# Patient Record
Sex: Male | Born: 1985 | Race: White | Hispanic: No | Smoking: Never smoker
Health system: Southern US, Community
[De-identification: ages and names within clinical notes are randomized; demographics above are authoritative.]

## PROBLEM LIST (undated history)

## (undated) DIAGNOSIS — T7840XA Allergy, unspecified, initial encounter: Secondary | ICD-10-CM

## (undated) DIAGNOSIS — B019 Varicella without complication: Secondary | ICD-10-CM

## (undated) HISTORY — DX: Varicella without complication: B01.9

## (undated) HISTORY — PX: OTHER SURGICAL HISTORY: SHX169

## (undated) HISTORY — DX: Allergy, unspecified, initial encounter: T78.40XA

---

## 2011-10-01 ENCOUNTER — Ambulatory Visit: Payer: Self-pay | Admitting: Family Medicine

## 2011-10-07 ENCOUNTER — Encounter: Payer: Self-pay | Admitting: Family Medicine

## 2011-10-07 ENCOUNTER — Ambulatory Visit (INDEPENDENT_AMBULATORY_CARE_PROVIDER_SITE_OTHER): Payer: Managed Care, Other (non HMO) | Admitting: Family Medicine

## 2011-10-07 DIAGNOSIS — Z0000 Encounter for general adult medical examination without abnormal findings: Secondary | ICD-10-CM

## 2011-10-07 DIAGNOSIS — R05 Cough: Secondary | ICD-10-CM

## 2011-10-07 MED ORDER — TETANUS-DIPHTH-ACELL PERTUSSIS 5-2.5-18.5 LF-MCG/0.5 IM SUSP
0.5000 mL | Freq: Once | INTRAMUSCULAR | Status: AC
  Administered 2011-10-07: 0.5 mL via INTRAMUSCULAR

## 2011-10-07 NOTE — Patient Instructions (Signed)
We'll notify you of your lab results Start the Prilosec OTC once daily to see if cough improves- if yes, call and we'll send in a prescription.  If no, we may need to do additional work up Call with any questions or concerns Your exam looks great! Welcome!  We're glad to have you!!!

## 2011-10-07 NOTE — Assessment & Plan Note (Signed)
New.  Pt's PE WNL.  Check labs.  Tdap booster given.  Anticipatory guidance provided.

## 2011-10-07 NOTE — Progress Notes (Signed)
  Subjective:    Patient ID: Dennis Mcclure, male    DOB: March 21, 1986, 26 y.o.   MRN: [id number]  HPI New to establish.  No prior PCP.  Desires CPE.   Review of Systems Patient reports no vision/hearing changes, anorexia, fever ,adenopathy, persistant/recurrent hoarseness, swallowing issues, chest pain, palpitations, edema, hemoptysis, dyspnea (rest,exertional, paroxysmal nocturnal), gastrointestinal  bleeding (melena, rectal bleeding), abdominal pain, excessive heart burn, GU symptoms (dysuria, hematuria, voiding/incontinence issues) syncope, focal weakness, memory loss, numbness & tingling, skin/hair/nail changes, depression, anxiety, abnormal bruising/bleeding, musculoskeletal symptoms/signs.   + cough w/ reclining or lying down.  Cannot relate this to food.  Denies burning, sour brash, SOB.  Cough is dry, not productive.  'i love spicy food'.    Objective:   Physical Exam BP 120/75  Pulse 71  Temp(Src) 98.2 F (36.8 C) (Oral)  Ht 5' 5.5" (1.664 m)  Wt 143 lb 3.2 oz (64.955 kg)  BMI 23.47 kg/m2  SpO2 96%  General Appearance:    Alert, cooperative, no distress, appears stated age  Head:    Normocephalic, without obvious abnormality, atraumatic  Eyes:    PERRL, conjunctiva/corneas clear, EOM's intact, fundi    benign, both eyes       Ears:    Normal TM's and external ear canals, both ears  Nose:   Nares normal, septum midline, mucosa normal, no drainage   or sinus tenderness  Throat:   Lips, mucosa, and tongue normal; teeth and gums normal  Neck:   Supple, symmetrical, trachea midline, no adenopathy;       thyroid:  No enlargement/tenderness/nodules  Back:     Symmetric, no curvature, ROM normal, no CVA tenderness  Lungs:     Clear to auscultation bilaterally, respirations unlabored  Chest wall:    No tenderness or deformity  Heart:    Regular rate and rhythm, S1 and S2 normal, no murmur, rub   or gallop  Abdomen:     Soft, non-tender, bowel sounds active all four quadrants,    no  masses, no organomegaly  Genitalia:    Normal male without lesion, discharge or tenderness  Rectal:    Deferred due to young age  Extremities:   Extremities normal, atraumatic, no cyanosis or edema  Pulses:   2+ and symmetric all extremities  Skin:   Skin color, texture, turgor normal, no rashes or lesions  Lymph nodes:   Cervical, supraclavicular, and axillary nodes normal  Neurologic:   CNII-XII intact. Normal strength, sensation and reflexes      throughout          Assessment & Plan:

## 2011-10-07 NOTE — Assessment & Plan Note (Signed)
New.  Suspect this is due to silent GERD.  Samples of Prilosec OTC given.  Will follow.

## 2011-10-08 LAB — CBC WITH DIFFERENTIAL/PLATELET
Basophils Relative: 0.5 % (ref 0.0–3.0)
Eosinophils Absolute: 0.1 10*3/uL (ref 0.0–0.7)
MCHC: 33.4 g/dL (ref 30.0–36.0)
MCV: 85.3 fl (ref 78.0–100.0)
Monocytes Absolute: 0.5 10*3/uL (ref 0.1–1.0)
Neutrophils Relative %: 28.9 % — ABNORMAL LOW (ref 43.0–77.0)
RBC: 5.06 Mil/uL (ref 4.22–5.81)

## 2011-10-08 LAB — LIPID PANEL
Cholesterol: 99 mg/dL (ref 0–200)
HDL: 38.7 mg/dL — ABNORMAL LOW (ref >39.00)
Triglycerides: 51 mg/dL (ref 0.0–149.0)
VLDL: 10.2 mg/dL (ref 0.0–40.0)

## 2011-10-08 LAB — BASIC METABOLIC PANEL
BUN: 11 mg/dL (ref 6–23)
Creatinine, Ser: 0.8 mg/dL (ref 0.4–1.5)
GFR: 119.4 mL/min (ref >60.00)
Potassium: 3.8 mEq/L (ref 3.5–5.1)

## 2011-10-08 LAB — HEPATIC FUNCTION PANEL
AST: 23 U/L (ref 0–37)
Total Bilirubin: 0.5 mg/dL (ref 0.3–1.2)

## 2011-10-08 LAB — TSH: TSH: 1.93 u[IU]/mL (ref 0.35–5.50)

## 2012-02-28 ENCOUNTER — Encounter: Payer: Self-pay | Admitting: Family Medicine

## 2012-02-28 ENCOUNTER — Telehealth: Payer: Self-pay | Admitting: Family Medicine

## 2012-02-28 ENCOUNTER — Ambulatory Visit (INDEPENDENT_AMBULATORY_CARE_PROVIDER_SITE_OTHER): Payer: Managed Care, Other (non HMO) | Admitting: Family Medicine

## 2012-02-28 VITALS — BP 127/83 | HR 120 | Temp 100.5°F | Ht 64.5 in | Wt 148.0 lb

## 2012-02-28 DIAGNOSIS — R509 Fever, unspecified: Secondary | ICD-10-CM

## 2012-02-28 LAB — CBC WITH DIFFERENTIAL/PLATELET
Basophils Absolute: 0 10*3/uL (ref 0.0–0.1)
Eosinophils Absolute: 0 10*3/uL (ref 0.0–0.7)
HCT: 41 % (ref 39.0–52.0)
Hemoglobin: 13.7 g/dL (ref 13.0–17.0)
Lymphs Abs: 1.1 10*3/uL (ref 0.7–4.0)
MCHC: 33.3 g/dL (ref 30.0–36.0)
MCV: 84.8 fl (ref 78.0–100.0)
Neutro Abs: 5.2 10*3/uL (ref 1.4–7.7)
RDW: 12.4 % (ref 11.5–14.6)

## 2012-02-28 NOTE — Telephone Encounter (Signed)
Advised MD Beverely Low about pt sxs, wanted to see if pt could come in for the apt at 1:45pm today, per pt at that time needed to cancel and noted still on schedule, please call and offer this apt to this pt, thank you

## 2012-02-28 NOTE — Telephone Encounter (Signed)
Caller: [place]/Patient; Patient Name: Dennis Mcclure; PCP: Sheliah Hatch.; Best Callback Phone Number: [phone]. Onset 02/24/12 Patient states he has headache, non-productive  cough, vomiting,runny nose, clear in color,  fever with chills (no thermometer to take temerature).  All emergent symptoms ruled out per Cough protocol with exception New onset of two or more of the following symptoms; nasal congestion with runny nose; snaazing; itchy or mild sore throat; m,ild headache or bady aches; mild fatigue; low grade fever up to 101.5 F usually lasting about a week."  Home care advice given.

## 2012-02-28 NOTE — Progress Notes (Signed)
  Subjective:    Patient ID: Dennis Mcclure, male    DOB: [birth year], 26 y.o.   MRN: [id number]  HPI Sxs started Thursday w/ nausea.  Later developed chills.  Alternating sweats and drenching sweats.  Vomited yesterday.  + cough.  Denies facial pain/pressure.  + R ear pain.  Mild runny nose.  + HA- R occipital.  + body aches.  No known sick contacts.   Review of Systems For ROS see HPI     Objective:   Physical Exam  Constitutional: He is oriented to person, place, and time. He appears well-developed and well-nourished. He appears distressed (ill appearing w/ shaking chills).  HENT:  Head: Normocephalic and atraumatic.  Nose: Nose normal.  Mouth/Throat: Oropharynx is clear and moist. No oropharyngeal exudate.       No TTP over sinuses TMs normal bilaterally  Neck: Normal range of motion. Neck supple.  Cardiovascular:       Mildly tachy but regular  Pulmonary/Chest: Effort normal and breath sounds normal. No respiratory distress. He has no wheezes. He has no rales.       Wet cough but not productive  Abdominal: Soft. Bowel sounds are normal. He exhibits no distension. There is no tenderness. There is no rebound and no guarding.  Lymphadenopathy:    He has no cervical adenopathy.  Neurological: He is alert and oriented to person, place, and time.  Skin: Skin is warm and dry.          Assessment & Plan:

## 2012-02-28 NOTE — Patient Instructions (Addendum)
Your flu test is negative- this is good news You don't seem to have a bacterial infection- your exam is normal This is most likely a virus and should improve w/ time LOTS of fluids Alternate tylenol/ibuprofen every 4 hrs while awake for fever/chills REST! Hang in there!!!

## 2012-02-28 NOTE — Telephone Encounter (Signed)
Pt coming in at 1:45pm today

## 2012-02-29 ENCOUNTER — Emergency Department (HOSPITAL_BASED_OUTPATIENT_CLINIC_OR_DEPARTMENT_OTHER)
Admission: EM | Admit: 2012-02-29 | Discharge: 2012-02-29 | Disposition: A | Discharge status: Home / Self Care | Payer: Managed Care, Other (non HMO) | Attending: Emergency Medicine | Admitting: Emergency Medicine

## 2012-02-29 ENCOUNTER — Encounter (HOSPITAL_BASED_OUTPATIENT_CLINIC_OR_DEPARTMENT_OTHER): Payer: Self-pay | Admitting: *Deleted

## 2012-02-29 ENCOUNTER — Emergency Department (HOSPITAL_BASED_OUTPATIENT_CLINIC_OR_DEPARTMENT_OTHER): Payer: Managed Care, Other (non HMO)

## 2012-02-29 ENCOUNTER — Encounter: Payer: Self-pay | Admitting: *Deleted

## 2012-02-29 DIAGNOSIS — J189 Pneumonia, unspecified organism: Secondary | ICD-10-CM

## 2012-02-29 DIAGNOSIS — J45909 Unspecified asthma, uncomplicated: Secondary | ICD-10-CM | POA: Insufficient documentation

## 2012-02-29 MED ORDER — AZITHROMYCIN 250 MG PO TABS: 250.0000 mg | ORAL_TABLET | Freq: Every day | ORAL | Status: DC

## 2012-02-29 MED ORDER — DEXTROSE 5 % IV SOLN
1.0000 g | Freq: Once | INTRAVENOUS | Status: AC
  Administered 2012-02-29: 1 g via INTRAVENOUS
  Filled 2012-02-29: qty 10

## 2012-02-29 MED ORDER — AZITHROMYCIN 250 MG PO TABS
500.0000 mg | ORAL_TABLET | Freq: Once | ORAL | Status: AC
  Administered 2012-02-29: 500 mg via ORAL
  Filled 2012-02-29: qty 2

## 2012-02-29 NOTE — ED Notes (Signed)
Patient transported to X-ray 

## 2012-02-29 NOTE — ED Notes (Signed)
Pt was seen yesterday by PMD and dx'd with viral illness but pt states he feels worse.

## 2012-02-29 NOTE — ED Notes (Signed)
C/o fever, headache, and cough since Thursday.

## 2012-02-29 NOTE — ED Provider Notes (Signed)
History     CSN: [id number]  Arrival date & time 02/29/12  2029   First MD Initiated Contact with Patient 02/29/12 2129      Chief Complaint  Patient presents with  . Headache  . Cough     HPI Patient comes in with persistent cough, fever.  Patient was seen yesterday in private medical doctor's office.  Flu test was negative.  No x-ray was done.  Patient denies vomiting.  Has had fever and sweats.  No significant shortness of breath. Past Medical History  Diagnosis Date  . Allergy   . Asthma   . Chicken pox     History reviewed. No pertinent past surgical history.  Family History  Problem Relation Age of Onset  . Mental retardation Maternal Aunt   . Mental retardation Paternal Uncle     History  Substance Use Topics  . Smoking status: Never Smoker   . Smokeless tobacco: Not on file  . Alcohol Use: No      Review of Systems  All other systems reviewed and are negative.    Allergies  Septra  Home Medications   Current Outpatient Rx  Name Route Sig Dispense Refill  . ACETAMINOPHEN 500 MG PO TABS Oral Take 500 mg by mouth every 6 (six) hours as needed.    . IBUPROFEN 200 MG PO TABS Oral Take 200 mg by mouth every 6 (six) hours as needed.    . AZITHROMYCIN 250 MG PO TABS Oral Take 1 tablet (250 mg total) by mouth daily. Take 1 tablet daily until gone 4 tablet 0    BP 132/84  Pulse 98  Temp 98.8 F (37.1 C) (Oral)  Resp 16  Ht 5\' 5"  (1.651 m)  Wt 140 lb (63.504 kg)  BMI 23.30 kg/m2  SpO2 98%  Physical Exam  Nursing note and vitals reviewed. Constitutional: He is oriented to person, place, and time. He appears well-developed and well-nourished. No distress.  HENT:  Head: Normocephalic and atraumatic.  Eyes: Pupils are equal, round, and reactive to light.  Neck: Normal range of motion.  Cardiovascular: Normal rate and intact distal pulses.   Pulmonary/Chest: No respiratory distress. He has no wheezes. He has rales.    Abdominal: Normal  appearance. He exhibits no distension.  Musculoskeletal: Normal range of motion.  Neurological: He is alert and oriented to person, place, and time. No cranial nerve deficit.  Skin: Skin is warm and dry. No rash noted.  Psychiatric: He has a normal mood and affect. His behavior is normal.    ED Course  Procedures (including critical care time)  Medications  acetaminophen (TYLENOL) 500 MG tablet (not administered)  ibuprofen (ADVIL,MOTRIN) 200 MG tablet (not administered)  cefTRIAXone (ROCEPHIN) 1 g in dextrose 5 % 50 mL IVPB (not administered)  azithromycin (ZITHROMAX) tablet 500 mg (not administered)  azithromycin (ZITHROMAX) 250 MG tablet (not administered)    Labs Reviewed - No data to display Dg Chest 2 View  02/29/2012  *RADIOLOGY REPORT*  Clinical Data: Cough, fever, and sore throat.  CHEST - 2 VIEW  Comparison: None.  Findings: There is hazy airspace opacity in the inferior aspect of the left upper lung and lingula consistent with focal pneumonia. Heart size and pulmonary vascularity are normal.  No blunting of costophrenic angles.  No pneumothorax.  Mediastinal contours appear intact.  Atrophy of the left first rib.  IMPRESSION: Airspace opacity in the left upper lung consistent with pneumonia.   Original Report Authenticated By: Marlon Pel,  M.D.      1. Community acquired pneumonia       MDM          Nelia Shi, MD 02/29/12 [phone]

## 2012-02-29 NOTE — ED Notes (Signed)
Pt tested negative for flu at pcp office yesterday

## 2012-02-29 NOTE — ED Notes (Signed)
Pt had visit with pcp yesterday diagnosed with viral illness,  +chills and diaphoresis, reports pain in right ear,

## 2012-02-29 NOTE — Discharge Instructions (Signed)

## 2012-03-07 NOTE — Assessment & Plan Note (Signed)
New.  Pt obviously not feeling well but no evidence of bacterial infxn on PE.  Lungs CTA, no sinus tenderness, TMs normal, abdomen benign.  Check CBC to assess WBC.  If WBC even slightly elevated will start abx for presumed bacterial infxn.  At this time, hesitant to start meds b/c I'm not sure what I would be treating.  Reviewed supportive care and red flags that should prompt return.  Pt expressed understanding and is in agreement w/ plan.

## 2012-03-09 ENCOUNTER — Ambulatory Visit (INDEPENDENT_AMBULATORY_CARE_PROVIDER_SITE_OTHER): Payer: Managed Care, Other (non HMO) | Admitting: Family Medicine

## 2012-03-09 ENCOUNTER — Encounter: Payer: Self-pay | Admitting: Family Medicine

## 2012-03-09 VITALS — BP 104/78 | HR 91 | Temp 98.3°F | Ht 66.0 in | Wt 146.6 lb

## 2012-03-09 DIAGNOSIS — J9801 Acute bronchospasm: Secondary | ICD-10-CM

## 2012-03-09 NOTE — Progress Notes (Signed)
  Subjective:    Patient ID: Dennis Mcclure, male    DOB: 19-Feb-1986, 26 y.o.   MRN: [id number]  HPI PNA- dx'd in ER recently and tx'd w/ Zpack.  Pt has hx of asthma triggered by URI.  Has previously used inhalers.  Is finding himself short of breath w/ cold air exposure and exercise.  No cough, no fevers.   Review of Systems For ROS see HPI     Objective:   Physical Exam  Vitals reviewed. Constitutional: He appears well-developed and well-nourished. No distress.  HENT:  Head: Normocephalic and atraumatic.  Nose: Nose normal.  Mouth/Throat: Oropharynx is clear and moist. No oropharyngeal exudate.  Neck: Normal range of motion. Neck supple.  Cardiovascular: Normal rate, regular rhythm and normal heart sounds.   Pulmonary/Chest: Effort normal and breath sounds normal. No respiratory distress. He has no wheezes. He has no rales.  Lymphadenopathy:    He has no cervical adenopathy.          Assessment & Plan:

## 2012-03-09 NOTE — Patient Instructions (Addendum)
This is bronchospasm (asthma) triggered by your recent infection Use the albuterol inhaler as needed- 2 puffs- for shortness of breath or wheezing REST!  You still have to recover! Ease back into exercise Call with any questions or concerns Hang in there!

## 2012-03-14 ENCOUNTER — Encounter: Payer: Self-pay | Admitting: Family Medicine

## 2012-03-14 NOTE — Assessment & Plan Note (Signed)
New.  Occuring after recent PNA.  Start albuterol HFA- sample given.  Reviewed supportive care and red flags that should prompt return.  Pt expressed understanding and is in agreement w/ plan.

## 2012-05-24 ENCOUNTER — Ambulatory Visit (INDEPENDENT_AMBULATORY_CARE_PROVIDER_SITE_OTHER): Payer: Managed Care, Other (non HMO) | Admitting: Family Medicine

## 2012-05-24 ENCOUNTER — Encounter: Payer: Self-pay | Admitting: Family Medicine

## 2012-05-24 VITALS — BP 110/60 | HR 85 | Temp 98.3°F | Ht 65.25 in | Wt 140.4 lb

## 2012-05-24 DIAGNOSIS — J329 Chronic sinusitis, unspecified: Secondary | ICD-10-CM

## 2012-05-24 NOTE — Progress Notes (Signed)
  Subjective:    Patient ID: Dennis Mcclure, male    DOB: 01/14/1986, 27 y.o.   MRN: [id number]  HPI URI- pt reports his daughter was 1st sick over Christmas, wife w/ sinus infection, yesterday had sinus pain/pressure.  Today some productive cough.  No fever currently- Tm 100 last week.  No N/V, + diarrhea initially but this resolved.  No ear pain.  Intermittent dry cough.  Pt otherwise thinks he is starting to improve.   Review of Systems For ROS see HPI     Objective:   Physical Exam  Vitals reviewed. Constitutional: He appears well-developed and well-nourished. No distress.  HENT:  Head: Normocephalic and atraumatic.       No TTP over sinuses + turbinate edema + PND TMs normal bilaterally  Eyes: Conjunctivae normal and EOM are normal. Pupils are equal, round, and reactive to light.  Neck: Normal range of motion. Neck supple.  Cardiovascular: Normal rate, regular rhythm and normal heart sounds.   Pulmonary/Chest: Effort normal and breath sounds normal. No respiratory distress. He has no wheezes.  Lymphadenopathy:    He has no cervical adenopathy.  Skin: Skin is warm and dry.          Assessment & Plan:

## 2012-05-24 NOTE — Patient Instructions (Addendum)
This appears to be the tail end of a sinus infection No need for meds at this time but if symptoms change or worsen- call me!!! Call with any questions or concerns Happy New Year!!!

## 2012-05-25 NOTE — Assessment & Plan Note (Signed)
New.  Appears to be resolving spontaneously.  Pt well appearing, no evidence of bacterial infxn on exam today.  Warned pt of potential 2nd sickening and he is to call if he again feels poorly so we can write an abx.  Reviewed supportive care and red flags that should prompt return.  Pt expressed understanding and is in agreement w/ plan.

## 2012-06-02 ENCOUNTER — Telehealth: Payer: Self-pay | Admitting: *Deleted

## 2012-06-02 MED ORDER — AZITHROMYCIN 250 MG PO TABS: ORAL_TABLET | ORAL | Status: DC

## 2012-06-02 NOTE — Telephone Encounter (Signed)
Spoke with pts wife, she states pt is still having chest congestion with associated productive cough (yellow to green sputum). Feels poorly and wants to know if he needs to come back in or can you call in abx. Please advise.

## 2012-06-02 NOTE — Telephone Encounter (Signed)
LM @ (10:42am) asking the pt to RTC.//AB/CMA

## 2012-06-02 NOTE — Telephone Encounter (Signed)
Ok for Zpack 

## 2013-02-19 ENCOUNTER — Ambulatory Visit (INDEPENDENT_AMBULATORY_CARE_PROVIDER_SITE_OTHER): Payer: Managed Care, Other (non HMO) | Admitting: Family Medicine

## 2013-02-19 ENCOUNTER — Encounter: Payer: Self-pay | Admitting: Family Medicine

## 2013-02-19 VITALS — BP 124/82 | HR 88 | Temp 98.2°F | Resp 16 | Wt 147.2 lb

## 2013-02-19 DIAGNOSIS — J029 Acute pharyngitis, unspecified: Secondary | ICD-10-CM | POA: Insufficient documentation

## 2013-02-19 DIAGNOSIS — J329 Chronic sinusitis, unspecified: Secondary | ICD-10-CM

## 2013-02-19 LAB — POCT RAPID STREP A (OFFICE): Rapid Strep A Screen: NEGATIVE

## 2013-02-19 MED ORDER — AMOXICILLIN 875 MG PO TABS: 875.0000 mg | ORAL_TABLET | Freq: Two times a day (BID) | ORAL | Status: DC

## 2013-02-19 NOTE — Patient Instructions (Addendum)
Start the Amoxicillin twice daily- take w/ food Drink plenty of fluids Alternate tylenol and ibuprofen for pain/fever Mucinex DM to thin your congestion and for cough REST! Hang in there!

## 2013-02-19 NOTE — Assessment & Plan Note (Signed)
Pt's sxs and PE consistent w/ infxn.  Start abx.  Reviewed supportive care and red flags that should prompt return.  Pt expressed understanding and is in agreement w/ plan.  

## 2013-02-19 NOTE — Progress Notes (Signed)
  Subjective:    Patient ID: Dennis Mcclure, male    DOB: May 19, 1985, 27 y.o.   MRN: [id number]  HPI URI- sxs started 2 days ago w/ sore throat.  + nasal congestion, chills, dizziness.  + facial pressure in maxillary sinuses.  No ear pain.  Hx of PNA w/ similar sxs last year.  Coughing only to expel PND.  + vomiting this AM.  No diarrhea.   Review of Systems For ROS see HPI     Objective:   Physical Exam  Vitals reviewed. Constitutional: He appears well-developed and well-nourished. No distress.  HENT:  Head: Normocephalic and atraumatic.  Right Ear: Tympanic membrane normal.  Left Ear: Tympanic membrane normal.  Nose: Mucosal edema and rhinorrhea present. Right sinus exhibits maxillary sinus tenderness. Right sinus exhibits no frontal sinus tenderness. Left sinus exhibits maxillary sinus tenderness. Left sinus exhibits no frontal sinus tenderness.  Mouth/Throat: Mucous membranes are normal. Oropharyngeal exudate and posterior oropharyngeal erythema present. No posterior oropharyngeal edema.  + PND  Eyes: Conjunctivae and EOM are normal. Pupils are equal, round, and reactive to light.  Neck: Normal range of motion. Neck supple.  Cardiovascular: Normal rate, regular rhythm and normal heart sounds.   Pulmonary/Chest: Effort normal and breath sounds normal. No respiratory distress. He has no wheezes.  + hacking cough  Lymphadenopathy:    He has no cervical adenopathy.  Skin: Skin is warm and dry.          Assessment & Plan:

## 2013-02-19 NOTE — Assessment & Plan Note (Signed)
Rapid strep negative but will tx sinuses w/ Amox that will cover for possible strep anyway.  Pt expressed understanding and is in agreement w/ plan.

## 2013-08-09 ENCOUNTER — Encounter: Payer: Self-pay | Admitting: Family Medicine

## 2013-08-09 ENCOUNTER — Ambulatory Visit (INDEPENDENT_AMBULATORY_CARE_PROVIDER_SITE_OTHER): Payer: Managed Care, Other (non HMO) | Admitting: Family Medicine

## 2013-08-09 VITALS — BP 120/80 | HR 81 | Temp 98.4°F | Resp 16 | Wt 150.4 lb

## 2013-08-09 DIAGNOSIS — L909 Atrophic disorder of skin, unspecified: Secondary | ICD-10-CM

## 2013-08-09 DIAGNOSIS — L919 Hypertrophic disorder of the skin, unspecified: Secondary | ICD-10-CM

## 2013-08-09 DIAGNOSIS — L918 Other hypertrophic disorders of the skin: Secondary | ICD-10-CM

## 2013-08-09 NOTE — Progress Notes (Signed)
   Subjective:    Patient ID: Dennis Mcclure, male    DOB: [birth year], 28 y.o.   MRN: [id number]  HPI Mole- not painful unless snagged.  'just appeared a month ago'.  Now sticking out further than before.  No bleeding.  'this one's pink.  It's weird'.   Review of Systems For ROS see HPI     Objective:   Physical Exam  Vitals reviewed. Constitutional: He appears well-developed and well-nourished. No distress.  Skin: Skin is warm and dry.  1.5 cm pink, fleshy skin tag on L upper back. Area prepped w/ ETOH, injected w/ 1/2 cc 1% lido and skin tag removed w/ sterile scissors.  Pressure applied to stop bleeding and silver nitrate applied.  Triple abx ointment and bandaid applied.          Assessment & Plan:

## 2013-08-09 NOTE — Assessment & Plan Note (Signed)
New.  Removed w/o difficulty at pt's request due to skin irritation when it is snagged on clothing.  Pt tolerated procedure well and understand post-procedure instructions.

## 2013-08-09 NOTE — Progress Notes (Signed)
Pre visit review using our clinic review tool, if applicable. No additional management support is needed unless otherwise documented below in the visit note. 

## 2013-08-09 NOTE — Patient Instructions (Signed)
Keep area clean and dry Apply Neosporin to area 1-2x/day Watch for any green drainage or surrounding redness- this would be a sign of infection (which is always possible after breaking the skin) Call with any questions or concerns Happy Spring!

## 2013-11-12 ENCOUNTER — Ambulatory Visit (INDEPENDENT_AMBULATORY_CARE_PROVIDER_SITE_OTHER): Payer: Managed Care, Other (non HMO) | Admitting: Family Medicine

## 2013-11-12 ENCOUNTER — Encounter: Payer: Self-pay | Admitting: Family Medicine

## 2013-11-12 VITALS — BP 124/84 | HR 100 | Temp 98.8°F | Resp 16 | Wt 148.0 lb

## 2013-11-12 DIAGNOSIS — J029 Acute pharyngitis, unspecified: Secondary | ICD-10-CM

## 2013-11-12 DIAGNOSIS — J209 Acute bronchitis, unspecified: Secondary | ICD-10-CM

## 2013-11-12 LAB — POCT RAPID STREP A (OFFICE): RAPID STREP A SCREEN: NEGATIVE

## 2013-11-12 MED ORDER — AZITHROMYCIN 250 MG PO TABS: ORAL_TABLET | ORAL | Status: DC

## 2013-11-12 MED ORDER — PROMETHAZINE-DM 6.25-15 MG/5ML PO SYRP: 5.0000 mL | ORAL_SOLUTION | Freq: Four times a day (QID) | ORAL | Status: DC | PRN

## 2013-11-12 NOTE — Patient Instructions (Signed)
Follow up as needed Start the Zpack as directed Drink plenty of fluids Mucinex DM for daytime cough Cough syrup for nights/weekends REST! Call with any questions or concerns Hang in there!

## 2013-11-12 NOTE — Assessment & Plan Note (Signed)
New.  Pt's sxs and PE consistent w/ bronchitis.  Start Zpack due to recent contact w/ sick employees.  Cough meds prn.  Reviewed supportive care and red flags that should prompt return.  Pt expressed understanding and is in agreement w/ plan.

## 2013-11-12 NOTE — Progress Notes (Signed)
   Subjective:    Patient ID: Journee WoodRobet Leu, male    DOB: 07-10-1985, 28 y.o.   MRN: [id number]  Sore Throat  Associated symptoms include coughing.  Cough   URI- sxs started w/ sore throat, 'went to mucous', + productive cough, chest tightness.  Some frontal sinus pressure.  + subjective fever- 'it broke yesterday'.  sxs started 3 days ago.  No ear pain.  + sick contacts.   Review of Systems  Respiratory: Positive for cough.    For ROS see HPI     Objective:   Physical Exam  Constitutional: He appears well-developed and well-nourished. No distress.  HENT:  Head: Normocephalic and atraumatic.  No TTP over sinuses + turbinate edema + PND, bilateral tonsillar enlargement w/ erythema on exudate on L tonsil TMs normal bilaterally  Eyes: Conjunctivae and EOM are normal. Pupils are equal, round, and reactive to light.  Neck: Normal range of motion. Neck supple.  Cardiovascular: Normal rate, regular rhythm and normal heart sounds.   Pulmonary/Chest: Effort normal and breath sounds normal. No respiratory distress. He has no wheezes.  Lymphadenopathy:    He has no cervical adenopathy.  Skin: Skin is warm and dry.          Assessment & Plan:

## 2013-11-12 NOTE — Progress Notes (Signed)
Pre visit review using our clinic review tool, if applicable. No additional management support is needed unless otherwise documented below in the visit note. 

## 2013-11-12 NOTE — Assessment & Plan Note (Signed)
Rapid strep negative.  Zpack given for bronchitis would cover any possible strep.  Reviewed supportive care and red flags that should prompt return.  Pt expressed understanding and is in agreement w/ plan.

## 2013-12-07 ENCOUNTER — Ambulatory Visit (INDEPENDENT_AMBULATORY_CARE_PROVIDER_SITE_OTHER): Payer: Managed Care, Other (non HMO) | Admitting: Psychology

## 2013-12-07 DIAGNOSIS — F411 Generalized anxiety disorder: Secondary | ICD-10-CM

## 2013-12-07 DIAGNOSIS — F331 Major depressive disorder, recurrent, moderate: Secondary | ICD-10-CM

## 2013-12-21 ENCOUNTER — Telehealth: Payer: Self-pay | Admitting: Family Medicine

## 2013-12-21 MED ORDER — FLUOXETINE HCL 20 MG PO TABS: 20.0000 mg | ORAL_TABLET | Freq: Every day | ORAL | Status: DC

## 2013-12-21 NOTE — Telephone Encounter (Signed)
Called pt and lmovm to discuss medication and to verify where he would like it to be sent.

## 2013-12-21 NOTE — Telephone Encounter (Signed)
Ok for Prozac 20mg  daily, #30, 3 refills.  Needs OV to f/u in 4 weeks to recheck mood

## 2013-12-21 NOTE — Telephone Encounter (Signed)
Med filled, pt notified, and appt made.

## 2013-12-21 NOTE — Telephone Encounter (Signed)
Pt was recommend from therapist to follow up regarding discussing mild anti depressant.

## 2013-12-31 ENCOUNTER — Ambulatory Visit (INDEPENDENT_AMBULATORY_CARE_PROVIDER_SITE_OTHER): Payer: Managed Care, Other (non HMO) | Admitting: Psychology

## 2013-12-31 DIAGNOSIS — F411 Generalized anxiety disorder: Secondary | ICD-10-CM

## 2013-12-31 DIAGNOSIS — F331 Major depressive disorder, recurrent, moderate: Secondary | ICD-10-CM

## 2014-01-18 ENCOUNTER — Encounter: Payer: Self-pay | Admitting: Family Medicine

## 2014-01-18 ENCOUNTER — Ambulatory Visit (INDEPENDENT_AMBULATORY_CARE_PROVIDER_SITE_OTHER): Payer: Managed Care, Other (non HMO) | Admitting: Family Medicine

## 2014-01-18 VITALS — BP 118/80 | HR 87 | Temp 98.0°F | Resp 16 | Wt 145.1 lb

## 2014-01-18 DIAGNOSIS — F341 Dysthymic disorder: Secondary | ICD-10-CM

## 2014-01-18 DIAGNOSIS — F418 Other specified anxiety disorders: Secondary | ICD-10-CM | POA: Insufficient documentation

## 2014-01-18 DIAGNOSIS — R079 Chest pain, unspecified: Secondary | ICD-10-CM | POA: Insufficient documentation

## 2014-01-18 MED ORDER — FLUOXETINE HCL 40 MG PO CAPS: 40.0000 mg | ORAL_CAPSULE | Freq: Every day | ORAL | Status: DC

## 2014-01-18 NOTE — Progress Notes (Signed)
Pre visit review using our clinic review tool, if applicable. No additional management support is needed unless otherwise documented below in the visit note. 

## 2014-01-18 NOTE — Progress Notes (Signed)
   Subjective:    Patient ID: Dennis Mcclure, male    DOB: [birth year], 28 y.o.   MRN: [id number]  HPI Depression- pt started Prozac on 8/7.  Still having anxiety symptoms- CP, SOB when anxious or upset.  Worse w/ exertion- no cardiac w/u, pt has never mentioned this to medical provider.  No known family hx of heart issues.  No longer in counseling- 'it was marriage counseling and we decided to separate'.  Still living together despite decision to separate.  Pt is trying to go through mediation but reports things are getting hostile in regards to custody (wife wants to take daughter to [facility]).   Review of Systems For ROS see HPI     Objective:   Physical Exam  Vitals reviewed. Constitutional: He is oriented to person, place, and time. He appears well-developed and well-nourished. No distress.  HENT:  Head: Normocephalic and atraumatic.  Eyes: Conjunctivae and EOM are normal. Pupils are equal, round, and reactive to light.  Neck: Normal range of motion. Neck supple. No thyromegaly present.  Cardiovascular: Normal rate, regular rhythm, normal heart sounds and intact distal pulses.   No murmur heard. Pulmonary/Chest: Effort normal and breath sounds normal. No respiratory distress.  Musculoskeletal: He exhibits no edema.  Lymphadenopathy:    He has no cervical adenopathy.  Neurological: He is alert and oriented to person, place, and time. No cranial nerve deficit.  Skin: Skin is warm and dry.  Psychiatric: His behavior is normal.  Flat affect          Assessment & Plan:

## 2014-01-18 NOTE — Assessment & Plan Note (Signed)
New to provider.  Pt is separating from wife and facing a custody battle for daughter.  Increase Prozac to  and monitor for improvement.  Encouraged pt to find a stress outlet and recommended he seek individual counseling.  Will follow.

## 2014-01-18 NOTE — Assessment & Plan Note (Signed)
New.  Suspect this is due to pt's ongoing stress but the correlation w/ exertion is slightly atypical for stress chest pain.  EKG shows short PR and incomplete RBB.  Due to pt's sxs, will refer to cards for evaluation and tx.  Reviewed supportive care and red flags that should prompt return.  Pt expressed understanding and is in agreement w/ plan.

## 2014-01-18 NOTE — Patient Instructions (Addendum)
Follow up in 4-6 weeks to recheck mood Your EKG does show a variant that with your chest pain is worth checking out- nothing urgent, and likely normal for you, but we don't have anything to compare it to.  We'll call you with your cardiology appt Increase the Prozac to - 2 of what you have at home and one of your new script Try and find a stress outlet- art, music, exercise, talking it out If your symptoms change or worsen- please call! Hang in there!!

## 2014-03-01 ENCOUNTER — Encounter: Payer: Self-pay | Admitting: Family Medicine

## 2014-03-01 ENCOUNTER — Ambulatory Visit (INDEPENDENT_AMBULATORY_CARE_PROVIDER_SITE_OTHER): Payer: Managed Care, Other (non HMO) | Admitting: Family Medicine

## 2014-03-01 VITALS — BP 120/80 | HR 95 | Temp 98.1°F | Resp 16 | Wt 150.0 lb

## 2014-03-01 DIAGNOSIS — Z23 Encounter for immunization: Secondary | ICD-10-CM

## 2014-03-01 DIAGNOSIS — F341 Dysthymic disorder: Secondary | ICD-10-CM

## 2014-03-01 DIAGNOSIS — F418 Other specified anxiety disorders: Secondary | ICD-10-CM

## 2014-03-01 NOTE — Progress Notes (Signed)
   Subjective:    Patient ID: Robet Leuravis Allegretto, male    DOB: [birth year], 28 y.o.   MRN: [id number]  HPI Depression w/ anxiety- ongoing issue.  Ex wife and daughter left for [place] 2.5 weeks ago.  Talking to daughter daily.  Plan is for her to visit for 1 week in January.  'i don't know what i feel'.  Stopped Prozac.  'i just didn't really need it'.   Review of Systems For ROS see HPI     Objective:   Physical Exam  Vitals reviewed. Constitutional: He is oriented to person, place, and time. He appears well-developed and well-nourished. No distress.  Neurological: He is alert and oriented to person, place, and time.  Skin: Skin is warm and dry.  Psychiatric: He has a normal mood and affect. His behavior is normal. Thought content normal.          Assessment & Plan:

## 2014-03-01 NOTE — Progress Notes (Signed)
Pre visit review using our clinic review tool, if applicable. No additional management support is needed unless otherwise documented below in the visit note. 

## 2014-03-01 NOTE — Patient Instructions (Signed)
Schedule your complete physical in 6 months- sooner if you need me Keep up the good work on identifying your feelings Call if the bad days outnumber the good Call with any questions or concerns Verlon AuWe're here for you!  Call if you need us!

## 2014-03-03 NOTE — Assessment & Plan Note (Signed)
Improved since wife moved out but pt is struggling w/ daughter's move to [place].  Pt doesn't feel he needs meds at this time.  Working on better custody agreement so he would get more time w/ daughter.  Will continue to follow.  Pt aware to call if the bad days outnumber the good and we can revisit starting meds.

## 2014-03-04 ENCOUNTER — Encounter: Payer: Self-pay | Admitting: Family Medicine

## 2014-03-13 ENCOUNTER — Ambulatory Visit (INDEPENDENT_AMBULATORY_CARE_PROVIDER_SITE_OTHER): Payer: Managed Care, Other (non HMO) | Admitting: Family Medicine

## 2014-03-13 ENCOUNTER — Encounter: Payer: Self-pay | Admitting: Family Medicine

## 2014-03-13 VITALS — BP 120/88 | HR 92 | Temp 98.0°F | Resp 16 | Wt 154.2 lb

## 2014-03-13 DIAGNOSIS — N529 Male erectile dysfunction, unspecified: Secondary | ICD-10-CM | POA: Insufficient documentation

## 2014-03-13 DIAGNOSIS — N528 Other male erectile dysfunction: Secondary | ICD-10-CM

## 2014-03-13 NOTE — Progress Notes (Signed)
   Subjective:    Patient ID: Dennis Mcclure, male    DOB: 1985/11/08, 28 y.o.   MRN: [id number]  HPI ED- pt met a woman at a party recently and 'things did not go the way they normally go for me'.  Able to achieve erection but unable to maintain.  Still waking w/ morning erections but not as frequently as previous.  Longer time to ejaculation.  Pt recently split from wife, his only sexual partner x12 yrs.   Review of Systems For ROS see HPI     Objective:   Physical Exam  Vitals reviewed. Constitutional: He is oriented to person, place, and time. He appears well-developed and well-nourished. No distress.  HENT:  Head: Normocephalic and atraumatic.  Neurological: He is alert and oriented to person, place, and time.  Skin: Skin is warm and dry.  Psychiatric: He has a normal mood and affect. His behavior is normal. Thought content normal.          Assessment & Plan:

## 2014-03-13 NOTE — Progress Notes (Signed)
Pre visit review using our clinic review tool, if applicable. No additional management support is needed unless otherwise documented below in the visit note. 

## 2014-03-13 NOTE — Patient Instructions (Signed)
Follow up as needed We'll notify you of your lab results Call and set up an appt w/ Dr Dellia CloudGutterman to work through some of this emotional stuff YOU ARE NORMAL!!! Call with any questions or concerns Hang in there!

## 2014-03-14 LAB — TESTOSTERONE, FREE, TOTAL, SHBG
Sex Hormone Binding: 21 nmol/L (ref 13–71)
TESTOSTERONE-% FREE: 2.5 % (ref 1.6–2.9)
TESTOSTERONE: 319 ng/dL (ref 300–890)
Testosterone, Free: 79.9 pg/mL (ref 47.0–244.0)

## 2014-03-14 NOTE — Assessment & Plan Note (Signed)
New.  Suspect that this is anxiety related and not a true testosterone deficiency.  Will check labs to make sure no underlying hormonal issue.  Encouraged pt to resume therapy for anxiety.  Will follow.

## 2014-03-20 ENCOUNTER — Ambulatory Visit (INDEPENDENT_AMBULATORY_CARE_PROVIDER_SITE_OTHER): Payer: Managed Care, Other (non HMO) | Admitting: Cardiology

## 2014-03-20 ENCOUNTER — Encounter: Payer: Self-pay | Admitting: Cardiology

## 2014-03-20 VITALS — BP 118/75 | HR 80 | Ht 65.0 in | Wt 153.0 lb

## 2014-03-20 DIAGNOSIS — R072 Precordial pain: Secondary | ICD-10-CM

## 2014-03-20 DIAGNOSIS — R9431 Abnormal electrocardiogram [ECG] [EKG]: Secondary | ICD-10-CM

## 2014-03-20 NOTE — Progress Notes (Signed)
     HPI: 28 year old male for evaluation of chest pain. No prior cardiac history. Patient has mild dyspnea with extreme activities but not routine activities. No orthopnea, PND, pedal edema, palpitations or history of syncope. He does not have exertional chest pain. He has gone through a divorce recently. During that time he had occasional chest pain. It occured with stress and was substernal and described as a tightness. It would last 15 minutes and resolve spontaneously. No radiation or associated symptoms. Not pleuritic, positional or related to food. Since his divorce was finalized he has had no further chest pain. Cardiology asked to evaluate.  No current outpatient prescriptions on file.   No current facility-administered medications for this visit.    Allergies  Allergen Reactions  . Septra [Sulfamethoxazole-Trimethoprim]     Not sure, gave as an infant      Past Medical History  Diagnosis Date  . Allergy   . Asthma   . Chicken pox     Past Surgical History  Procedure Laterality Date  . No previous surgery      History   Social History  . Marital Status: Divorced    Spouse Name: N/A    Number of Children: 1  . Years of Education: N/A   Occupational History  . Not on file.   Social History Main Topics  . Smoking status: Never Smoker   . Smokeless tobacco: Not on file  . Alcohol Use: 0.0 oz/week    0 Not specified per week     Comment: Rarely  . Drug Use: No  . Sexual Activity: Not on file   Other Topics Concern  . Not on file   Social History Narrative    Family History  Problem Relation Age of Onset  . Mental retardation Maternal Aunt   . Mental retardation Paternal Uncle   . Stroke Maternal Grandfather     ROS: no fevers or chills, productive cough, hemoptysis, dysphasia, odynophagia, melena, hematochezia, dysuria, hematuria, rash, seizure activity, orthopnea, PND, pedal edema, claudication. Remaining systems are negative.  Physical Exam:    Blood pressure 118/75, pulse 80, height 5\' 5"  (1.651 m), weight 153 lb (69.4 kg).  General:  Well developed/well nourished in NAD Skin warm/dry Patient not depressed No peripheral clubbing Back-normal HEENT-normal/normal eyelids Neck supple/normal carotid upstroke bilaterally; no bruits; no JVD; no thyromegaly chest - CTA/ normal expansion CV - RRR/normal S1 and S2; no murmurs, rubs or gallops;  PMI nondisplaced Abdomen -NT/ND, no HSM, no mass, + bowel sounds, no bruit 2+ femoral pulses, no bruits Ext-no edema, chords, 2+ DP Neuro-grossly nonfocal  ECG 01/18/2014-sinus rhythm, right bundle branch block.

## 2014-03-20 NOTE — Patient Instructions (Signed)
Your physician recommends that you schedule a follow-up appointment in: AS NEEDED  

## 2014-03-20 NOTE — Assessment & Plan Note (Signed)
Symptoms are atypical and were related to stressful situations at the time of his divorce. They have resolved. His electrocardiogram shows sinus rhythm with right bundle branch block. There are no ST changes. I will not pursue further cardiac workup at this point. We will consider a treadmill in the future if he develops recurrent symptoms.

## 2014-03-20 NOTE — Assessment & Plan Note (Signed)
Patient with right bundle branch block. His chest pain is atypical and there are no ST changes. I will not pursue further cardiac workup unless he develops recurrent symptoms in the future.

## 2014-05-13 ENCOUNTER — Ambulatory Visit (INDEPENDENT_AMBULATORY_CARE_PROVIDER_SITE_OTHER): Payer: Managed Care, Other (non HMO) | Admitting: Family Medicine

## 2014-05-13 ENCOUNTER — Ambulatory Visit (INDEPENDENT_AMBULATORY_CARE_PROVIDER_SITE_OTHER): Payer: Managed Care, Other (non HMO)

## 2014-05-13 VITALS — BP 126/78 | HR 56 | Temp 98.2°F | Resp 16 | Ht 64.5 in | Wt 149.0 lb

## 2014-05-13 DIAGNOSIS — R05 Cough: Secondary | ICD-10-CM

## 2014-05-13 DIAGNOSIS — R059 Cough, unspecified: Secondary | ICD-10-CM

## 2014-05-13 DIAGNOSIS — J988 Other specified respiratory disorders: Secondary | ICD-10-CM

## 2014-05-13 DIAGNOSIS — J22 Unspecified acute lower respiratory infection: Secondary | ICD-10-CM

## 2014-05-13 MED ORDER — HYDROCODONE-HOMATROPINE 5-1.5 MG/5ML PO SYRP: 5.0000 mL | ORAL_SOLUTION | Freq: Three times a day (TID) | ORAL | Status: DC | PRN

## 2014-05-13 MED ORDER — BENZONATATE 100 MG PO CAPS: 200.0000 mg | ORAL_CAPSULE | Freq: Two times a day (BID) | ORAL | Status: DC | PRN

## 2014-05-13 NOTE — Progress Notes (Signed)
Chief Complaint:  Chief Complaint  Patient presents with  . Emesis  . chest congestion    since the first of December  . Cough    coughing up sputum    HPI: Dennis Mcclure is a 28 y.o. male who is here for 2-3 week hx of  Normal cold sxs, cough, mucus draiange, he had food posioning on Christmas eve and got worse after that. He worked a couple times and now he has chest congestion, mucus in the nose, coughing up yellow mucus, no fevers or chills. He has tried mucinex DM. He has asthma, he has not used inhaler or anything, No wheezing, but does have minimal SOB with late at night or early morning. He has not had problems with allergies. He got the flu vaccine 2 months ago. Has had PNA in 2013 left upper lung, feels similar. He has night sweats but has ahd chills.   Past Medical History  Diagnosis Date  . Allergy   . Asthma   . Chicken pox    Past Surgical History  Procedure Laterality Date  . No previous surgery     History   Social History  . Marital Status: Legally Separated    Spouse Name: N/A    Number of Children: 1  . Years of Education: N/A   Social History Main Topics  . Smoking status: Never Smoker   . Smokeless tobacco: None  . Alcohol Use: 0.0 oz/week    0 Not specified per week     Comment: Rarely  . Drug Use: No  . Sexual Activity: None   Other Topics Concern  . None   Social History Narrative   Family History  Problem Relation Age of Onset  . Mental retardation Maternal Aunt   . Mental retardation Paternal Uncle   . Stroke Maternal Grandfather    Allergies  Allergen Reactions  . Septra [Sulfamethoxazole-Trimethoprim]     Not sure, gave as an infant    Prior to Admission medications   Not on File     ROS: The patient denies night sweats, unintentional weight loss, chest pain, palpitations, wheezing, dyspnea on exertion, nausea, vomiting, abdominal pain, dysuria, hematuria, melena, numbness, weakness, or tingling.  All other systems  have been reviewed and were otherwise negative with the exception of those mentioned in the HPI and as above.    PHYSICAL EXAM: Filed Vitals:   05/13/14 1010  BP: 126/78  Pulse: 56  Temp: 98.2 F (36.8 C)  Resp: 16   Filed Vitals:   05/13/14 1010  Height: 5' 4.5" (1.638 m)  Weight: 149 lb (67.586 kg)   Body mass index is 25.19 kg/(m^2).  General: Alert, no acute distress HEENT:  Normocephalic, atraumatic, oropharynx patent. EOMI, PERRLA Erythematous throat, no exudates, TM normal, + sinus tenderness, + erythematous/boggy nasal mucosa Cardiovascular:  Regular rate and rhythm, no rubs murmurs or gallops.  No Carotid bruits, radial pulse intact. No pedal edema.  Respiratory: Clear to auscultation bilaterally.  No wheezes, rales, or rhonchi.  No cyanosis, no use of accessory musculature GI: No organomegaly, abdomen is soft and non-tender, positive bowel sounds.  No masses. Skin: No rashes. Neurologic: Facial musculature symmetric. Psychiatric: Patient is appropriate throughout our interaction. Lymphatic: No cervical lymphadenopathy Musculoskeletal: Gait intact.   LABS: Results for orders placed or performed in visit on 03/13/14  Testosterone, free, total  Result Value Ref Range   Testosterone 319 300 - 890 ng/dL   Sex Hormone Binding 21  13 - 71 nmol/L   Testosterone, Free 79.9 47.0 - 244.0 pg/mL   Testosterone-% Free 2.5 1.6 - 2.9 %     EKG/XRAY:   [facility]. Increase vascular markings , no acute cardiopulmonary process   ASSESSMENT/PLAN: Encounter Diagnoses  Name Primary?  . Cough   . Lower respiratory infection (e.g., bronchitis, pneumonia, pneumonitis, pulmonitis) Yes   Rx tessalon perles, hycodan Rx Azithromycin, not to take unless worsening sxs.  F/u prn   Gross sideeffects, risk and benefits, and alternatives of medications d/w patient. Patient is aware that all medications have potential sideeffects and we are unable to  predict every sideeffect or drug-drug interaction that may occur.  Patsye Sullivant PHUONG, DO 05/13/2014 11:28 AM

## 2014-05-13 NOTE — Patient Instructions (Signed)
Acute Bronchitis °Bronchitis is inflammation of the airways that extend from the windpipe into the lungs (bronchi). The inflammation often causes mucus to develop. This leads to a cough, which is the most common symptom of bronchitis.  °In acute bronchitis, the condition usually develops suddenly and goes away over time, usually in a couple weeks. Smoking, allergies, and asthma can make bronchitis worse. Repeated episodes of bronchitis may cause further lung problems.  °CAUSES °Acute bronchitis is most often caused by the same virus that causes a cold. The virus can spread from person to person (contagious) through coughing, sneezing, and touching contaminated objects. °SIGNS AND SYMPTOMS  °· Cough.   °· Fever.   °· Coughing up mucus.   °· Body aches.   °· Chest congestion.   °· Chills.   °· Shortness of breath.   °· Sore throat.   °DIAGNOSIS  °Acute bronchitis is usually diagnosed through a physical exam. Your health care provider will also ask you questions about your medical history. Tests, such as chest X-rays, are sometimes done to rule out other conditions.  °TREATMENT  °Acute bronchitis usually goes away in a couple weeks. Oftentimes, no medical treatment is necessary. Medicines are sometimes given for relief of fever or cough. Antibiotic medicines are usually not needed but may be prescribed in certain situations. In some cases, an inhaler may be recommended to help reduce shortness of breath and control the cough. A cool mist vaporizer may also be used to help thin bronchial secretions and make it easier to clear the chest.  °HOME CARE INSTRUCTIONS °· Get plenty of rest.   °· Drink enough fluids to keep your urine clear or pale yellow (unless you have a medical condition that requires fluid restriction). Increasing fluids may help thin your respiratory secretions (sputum) and reduce chest congestion, and it will prevent dehydration.   °· Take medicines only as directed by your health care provider. °· If  you were prescribed an antibiotic medicine, finish it all even if you start to feel better. °· Avoid smoking and secondhand smoke. Exposure to cigarette smoke or irritating chemicals will make bronchitis worse. If you are a smoker, consider using nicotine gum or skin patches to help control withdrawal symptoms. Quitting smoking will help your lungs heal faster.   °· Reduce the chances of another bout of acute bronchitis by washing your hands frequently, avoiding people with cold symptoms, and trying not to touch your hands to your mouth, nose, or eyes.   °· Keep all follow-up visits as directed by your health care provider.   °SEEK MEDICAL CARE IF: °Your symptoms do not improve after 1 week of treatment.  °SEEK IMMEDIATE MEDICAL CARE IF: °· You develop an increased fever or chills.   °· You have chest pain.   °· You have severe shortness of breath. °· You have bloody sputum.   °· You develop dehydration. °· You faint or repeatedly feel like you are going to pass out. °· You develop repeated vomiting. °· You develop a severe headache. °MAKE SURE YOU:  °· Understand these instructions. °· Will watch your condition. °· Will get help right away if you are not doing well or get worse. °Document Released: 06/10/2004 Document Revised: 09/17/2013 Document Reviewed: 10/24/2012 °[employer]® Patient Information ©2015 [employer]. This information is not intended to replace advice given to you by your health care provider. Make sure you discuss any questions you have with your health care provider. ° °

## 2014-05-15 ENCOUNTER — Telehealth: Payer: Self-pay | Admitting: Family Medicine

## 2014-05-15 MED ORDER — AZITHROMYCIN 250 MG PO TABS: ORAL_TABLET | ORAL | Status: DC

## 2014-05-15 NOTE — Telephone Encounter (Signed)
LM to call me back about chest xray results.

## 2014-05-17 IMAGING — CR DG CHEST 2V
2 series · 2 of 2 positions shown · non-contrast
Comparison: None.

CLINICAL DATA: Cough, fever, and sore throat.

CHEST - 2 VIEW

[w chest pa]
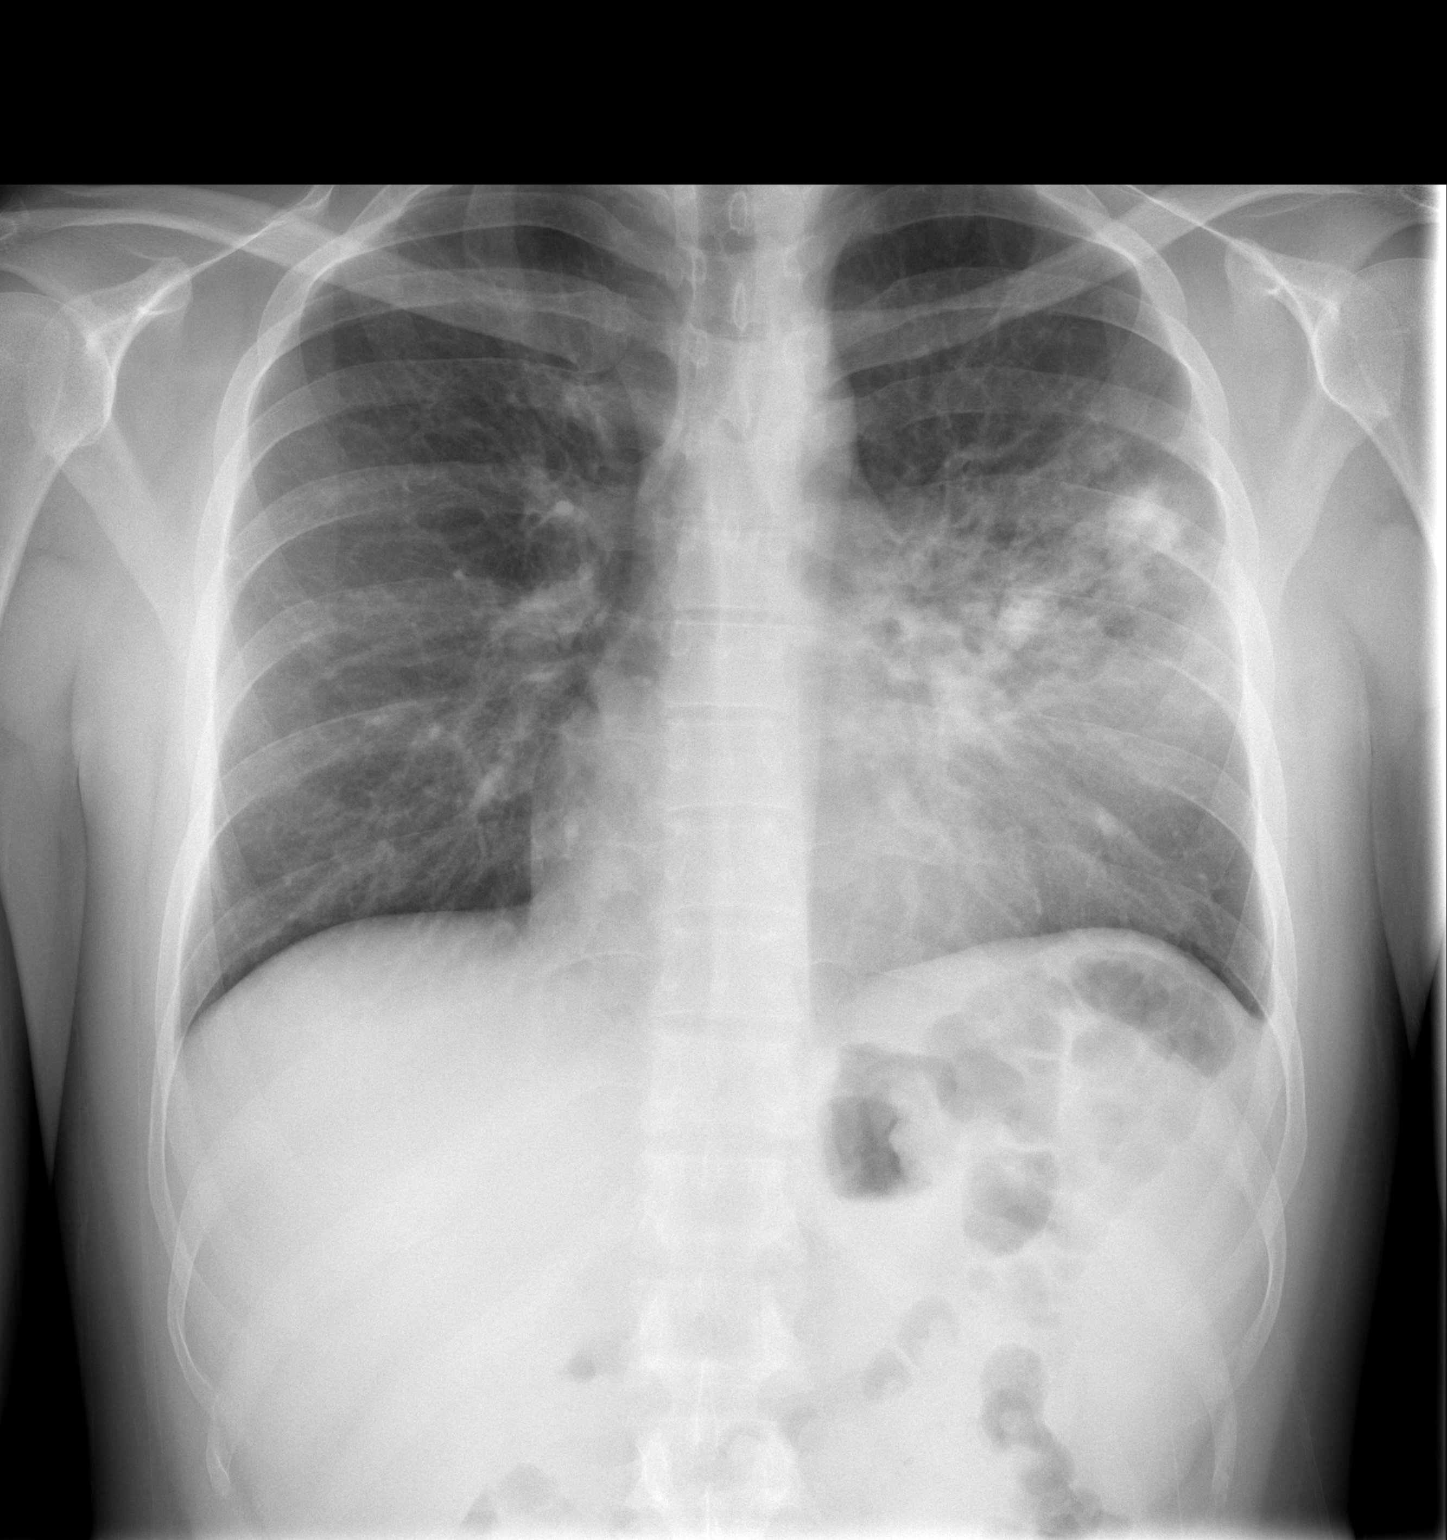

[w chest lat]
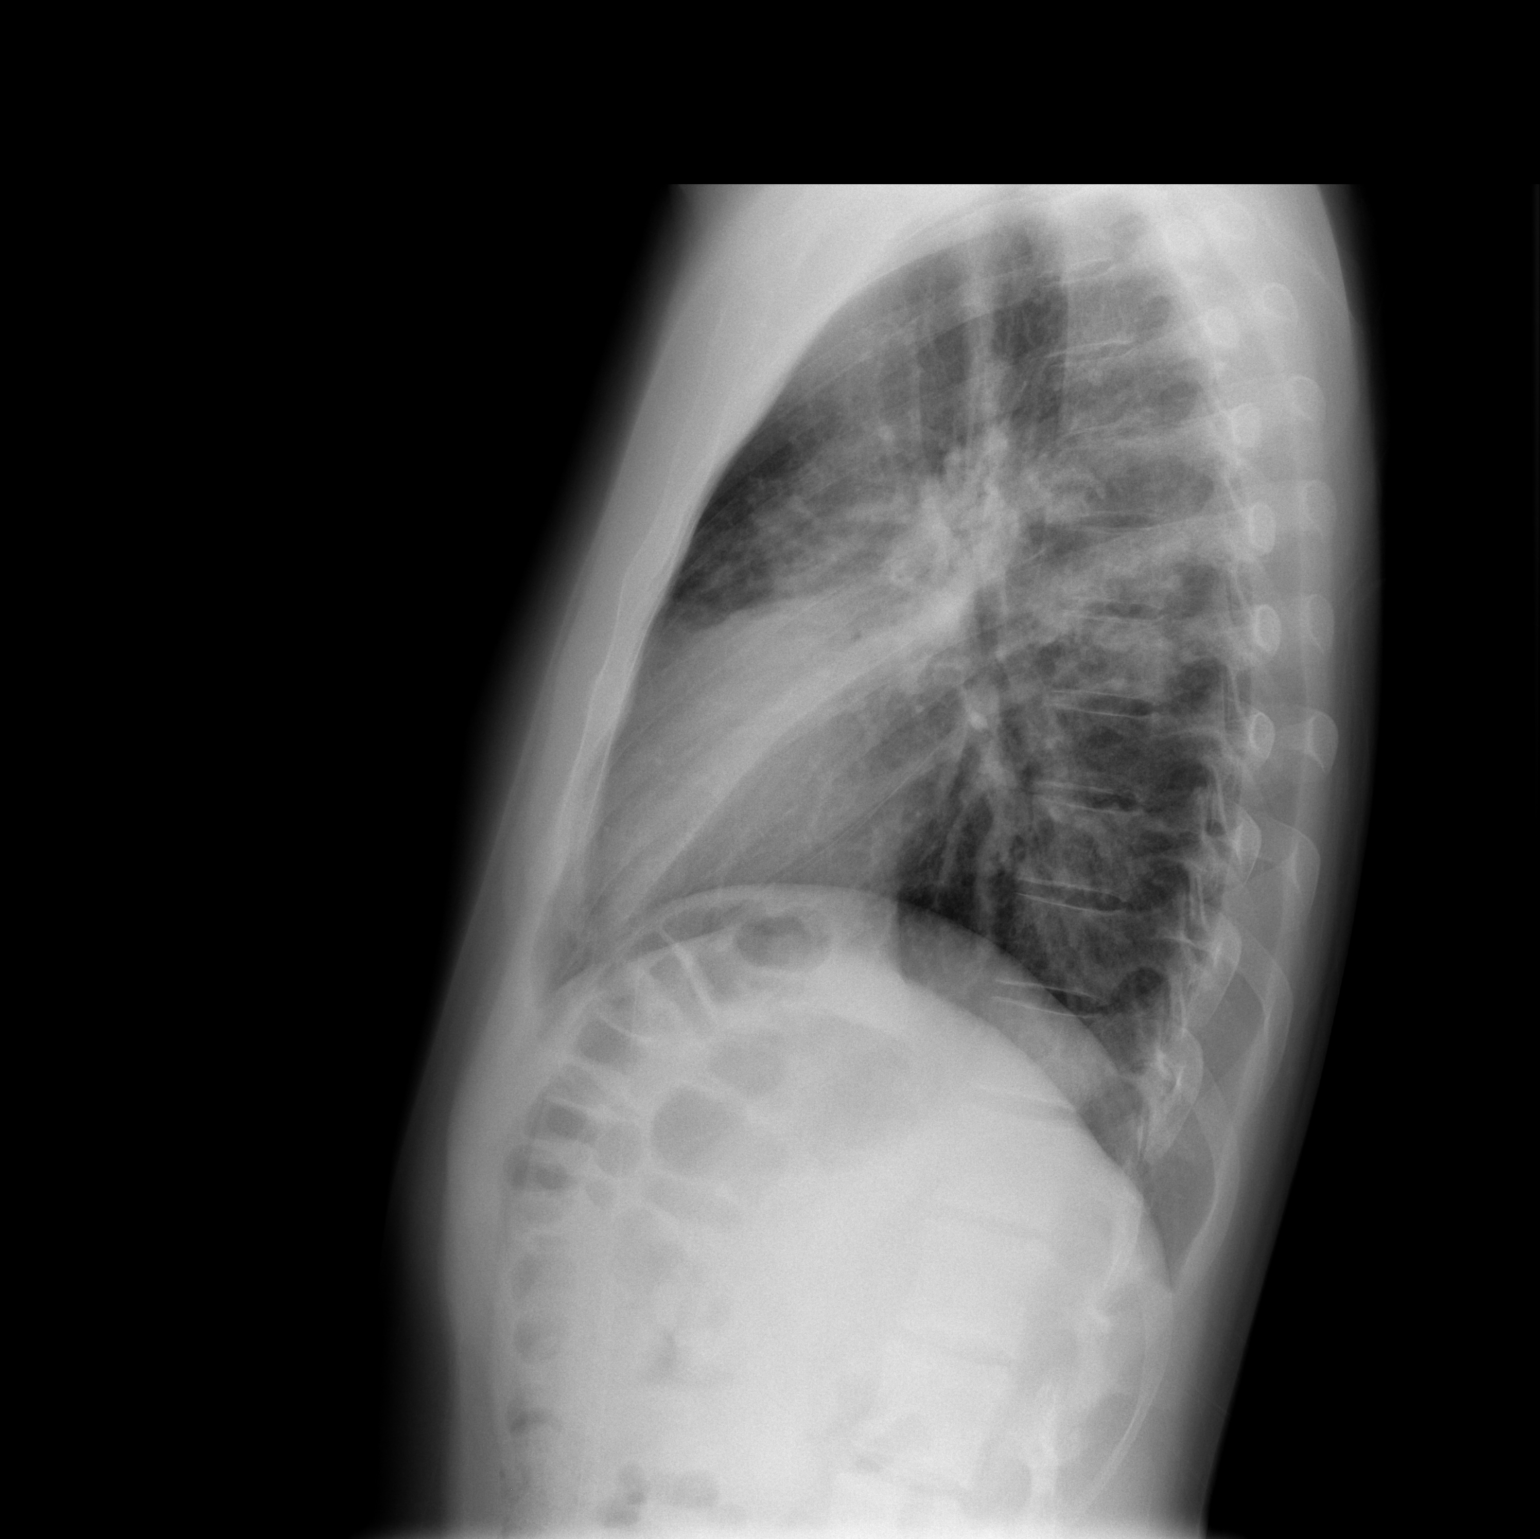

[2 of 2 positions shown; findings below may reference images not displayed]

FINDINGS: There is hazy airspace opacity in the inferior aspect of
the left upper lung and lingula consistent with focal pneumonia.
Heart size and pulmonary vascularity are normal.  No blunting of
costophrenic angles.  No pneumothorax.  Mediastinal contours appear
intact.  Atrophy of the left first rib.
IMPRESSION: Airspace opacity in the left upper lung consistent with pneumonia.

## 2014-08-20 ENCOUNTER — Telehealth: Payer: Self-pay | Admitting: Family Medicine

## 2014-08-20 NOTE — Telephone Encounter (Signed)
Pre visit letter sent  °

## 2014-08-25 ENCOUNTER — Telehealth: Payer: Managed Care, Other (non HMO) | Admitting: Physician Assistant

## 2014-08-25 DIAGNOSIS — R6889 Other general symptoms and signs: Secondary | ICD-10-CM

## 2014-08-25 NOTE — Progress Notes (Signed)
E visit for Flu like symptoms   We are sorry that you are not feeling well.  Here is how we plan to help! Based on what you have shared with me it looks like you may have flu-like symptoms that should be watched but do not seem to indicate anti-viral treatment.  Influenza or "the flu" is   an infection caused by a respiratory virus. The flu virus is highly contagious and persons who did not receive their yearly flu vaccination may "catch" the flu from close contact.  We have anti-viral medications to treat the viruses that cause this infection. They are not a "cure" and only shorten the course of the infection. These prescriptions are most effective when they are given within the first 2 days of "flu" symptoms. Antiviral medication are indicated if you have a high risk of complications from the flu. You should  also consider an antiviral medication if you are in close contact with someone who is at risk. These medications can help patients avoid complications from the flu  but have side effects that you should know. Possible side effects from Tamiflu or oseltamivir include nausea, vomiting, diarrhea, dizziness, headaches, eye redness, sleep problems or other respiratory symptoms. You should not take Tamiflu if you have an allergy to oseltamivir or any to the ingredients in Tamiflu.  Based upon your symptoms and potential risk factors I recommend that you follow the flu symptoms recommendation that I have listed below.   ANYONE WHO HAS FLU SYMPTOMS SHOULD: . Stay home. The flu is highly contagious and going out or to work exposes others! . Be sure to drink plenty of fluids. Water is fine as well as fruit juices, sodas and electrolyte beverages. You may want to stay away from caffeine or alcohol. If you are nauseated, try taking small sips of liquids. How do you know if you are getting enough fluid? Your urine should be a pale yellow or almost colorless. . Get rest. . Taking a steamy shower or using a  humidifier may help nasal congestion and ease sore throat pain. Using a saline nasal spray works much the same way. . Cough drops, hard candies and sore throat lozenges may ease your cough. . Line up a caregiver. Have someone check on you regularly. Alternate tylenol and ibuprofen for fever.   GET HELP RIGHT AWAY IF: . You cannot keep down liquids or your medications. . You become short of breath . Your fell like you are going to pass out or loose consciousness. . Your symptoms persist after you have completed your treatment plan MAKE SURE YOU   Understand these instructions.  Will watch your condition.  Will get help right away if you are not doing well or get worse.  Your e-visit answers were reviewed by a board certified advanced clinical practitioner to complete your personal care plan.  Depending on the condition, your plan could have included both over the counter or prescription medications.  If there is a problem please reply  once you have received a response from your provider.  Your safety is important to [place].  If you have drug allergies check your prescription carefully.    You can use MyChart to ask questions about today's visit, request a non-urgent call back, or ask for a work or school excuse.  You will get an e-mail in the next two days asking about your experience.  I hope that your e-visit has been valuable and will speed your recovery. Thank you for  using e-visits.

## 2014-08-26 ENCOUNTER — Encounter: Payer: Self-pay | Admitting: Physician Assistant

## 2014-08-26 ENCOUNTER — Ambulatory Visit (INDEPENDENT_AMBULATORY_CARE_PROVIDER_SITE_OTHER): Payer: Managed Care, Other (non HMO) | Admitting: Physician Assistant

## 2014-08-26 VITALS — BP 114/70 | HR 81 | Temp 97.9°F | Resp 16 | Ht 64.5 in | Wt 154.5 lb

## 2014-08-26 DIAGNOSIS — J208 Acute bronchitis due to other specified organisms: Secondary | ICD-10-CM

## 2014-08-26 MED ORDER — BENZONATATE 200 MG PO CAPS: 200.0000 mg | ORAL_CAPSULE | Freq: Two times a day (BID) | ORAL | Status: DC | PRN

## 2014-08-26 NOTE — Patient Instructions (Signed)
Please stay well hydrated. Get plenty of rest. Continue the Mucinex and Advil as directed. Start the [employer] Perles twice daily as directed. Your symptoms should continue to improve.

## 2014-08-26 NOTE — Progress Notes (Signed)
Pre visit review using our clinic review tool, if applicable. No additional management support is needed unless otherwise documented below in the visit note/SLS  

## 2014-08-27 DIAGNOSIS — J208 Acute bronchitis due to other specified organisms: Secondary | ICD-10-CM | POA: Insufficient documentation

## 2014-08-27 NOTE — Assessment & Plan Note (Signed)
Resolving.  Exam within normal limits.  Supportive measures discussed.  Rx Tessalon as directed.  Follow-up if symptoms not completely resolving.

## 2014-08-27 NOTE — Progress Notes (Signed)
    Patient presents to clinic today c/o chest congestion, sinus pressure, sweats and PND x 5 days.  Symptoms worst on day 2 but have been rapidly progressing since then.  Endorses symptoms mostly resolved at time of visit.  Has taken OTC cold medications for symptoms control.  Has history of asthma but patient denies use of inhaler.  Past Medical History  Diagnosis Date  . Allergy   . Asthma   . Chicken pox     No current outpatient prescriptions on file prior to visit.   No current facility-administered medications on file prior to visit.    Allergies  Allergen Reactions  . Septra [Sulfamethoxazole-Trimethoprim]     Not sure, gave as an infant     Family History  Problem Relation Age of Onset  . Mental retardation Maternal Aunt   . Mental retardation Paternal Uncle   . Stroke Maternal Grandfather     History   Social History  . Marital Status: Legally Separated    Spouse Name: N/A  . Number of Children: 1  . Years of Education: N/A   Social History Main Topics  . Smoking status: Never Smoker   . Smokeless tobacco: Not on file  . Alcohol Use: 0.0 oz/week    0 Standard drinks or equivalent per week     Comment: Rarely  . Drug Use: No  . Sexual Activity: Not on file   Other Topics Concern  . None   Social History Narrative   Review of Systems - See HPI.  All other ROS are negative.  BP 114/70 mmHg  Pulse 81  Temp(Src) 97.9 F (36.6 C) (Oral)  Resp 16  Ht 5' 4.5" (1.638 m)  Wt 154 lb 8 oz (70.081 kg)  BMI 26.12 kg/m2  SpO2 100%  Physical Exam  Constitutional: He is oriented to person, place, and time.  HENT:  Head: Normocephalic and atraumatic.  Right Ear: External ear normal.  Left Ear: External ear normal.  Nose: Nose normal.  Mouth/Throat: Oropharynx is clear and moist. No oropharyngeal exudate.  TM within normal limits bilaterally.  Eyes: Conjunctivae are normal.  Neck: Neck supple.  Cardiovascular: Normal rate, regular rhythm, normal heart  sounds and intact distal pulses.   Pulmonary/Chest: Effort normal and breath sounds normal. No respiratory distress. He has no wheezes. He has no rales. He exhibits no tenderness.  Neurological: He is alert and oriented to person, place, and time.  Skin: Skin is warm and dry. No rash noted.  Psychiatric: Affect normal.  Vitals reviewed.  Assessment/Plan: Viral bronchitis Resolving.  Exam within normal limits.  Supportive measures discussed.  Rx Tessalon as directed.  Follow-up if symptoms not completely resolving.

## 2014-09-09 ENCOUNTER — Telehealth: Payer: Self-pay | Admitting: *Deleted

## 2014-09-09 NOTE — Telephone Encounter (Signed)
Unable to reach patient at time of Pre-Visit Call.  Left message for patient to return call when available.    

## 2014-09-10 ENCOUNTER — Encounter: Payer: Self-pay | Admitting: Family Medicine

## 2014-09-10 ENCOUNTER — Ambulatory Visit (INDEPENDENT_AMBULATORY_CARE_PROVIDER_SITE_OTHER): Payer: Managed Care, Other (non HMO) | Admitting: Family Medicine

## 2014-09-10 VITALS — BP 116/80 | HR 88 | Temp 97.9°F | Resp 16 | Ht 65.0 in | Wt 154.1 lb

## 2014-09-10 DIAGNOSIS — Z0000 Encounter for general adult medical examination without abnormal findings: Secondary | ICD-10-CM

## 2014-09-10 LAB — CBC WITH DIFFERENTIAL/PLATELET
BASOS PCT: 0.3 % (ref 0.0–3.0)
Basophils Absolute: 0 10*3/uL (ref 0.0–0.1)
Eosinophils Absolute: 0.2 10*3/uL (ref 0.0–0.7)
Eosinophils Relative: 2.3 % (ref 0.0–5.0)
HEMATOCRIT: 41.1 % (ref 39.0–52.0)
HEMOGLOBIN: 13.9 g/dL (ref 13.0–17.0)
LYMPHS PCT: 31.1 % (ref 12.0–46.0)
Lymphs Abs: 2.1 10*3/uL (ref 0.7–4.0)
MCHC: 33.9 g/dL (ref 30.0–36.0)
MCV: 82.2 fl (ref 78.0–100.0)
Monocytes Absolute: 0.4 10*3/uL (ref 0.1–1.0)
Monocytes Relative: 5.8 % (ref 3.0–12.0)
NEUTROS ABS: 4.1 10*3/uL (ref 1.4–7.7)
NEUTROS PCT: 60.5 % (ref 43.0–77.0)
Platelets: 264 10*3/uL (ref 150.0–400.0)
RBC: 5 Mil/uL (ref 4.22–5.81)
RDW: 12.9 % (ref 11.5–15.5)
WBC: 6.8 10*3/uL (ref 4.0–10.5)

## 2014-09-10 LAB — LIPID PANEL
CHOL/HDL RATIO: 3
Cholesterol: 121 mg/dL (ref 0–200)
HDL: 39.3 mg/dL (ref >39.00)
LDL CALC: 62 mg/dL (ref 0–99)
NonHDL: 81.7
TRIGLYCERIDES: 99 mg/dL (ref 0.0–149.0)
VLDL: 19.8 mg/dL (ref 0.0–40.0)

## 2014-09-10 LAB — TSH: TSH: 1.36 u[IU]/mL (ref 0.35–4.50)

## 2014-09-10 LAB — HEPATIC FUNCTION PANEL
ALBUMIN: 4.5 g/dL (ref 3.5–5.2)
ALK PHOS: 115 U/L (ref 39–117)
ALT: 39 U/L (ref 0–53)
AST: 29 U/L (ref 0–37)
BILIRUBIN TOTAL: 0.3 mg/dL (ref 0.2–1.2)
Bilirubin, Direct: 0.1 mg/dL (ref 0.0–0.3)
TOTAL PROTEIN: 7.2 g/dL (ref 6.0–8.3)

## 2014-09-10 LAB — BASIC METABOLIC PANEL
BUN: 14 mg/dL (ref 6–23)
CHLORIDE: 105 meq/L (ref 96–112)
CO2: 31 meq/L (ref 19–32)
CREATININE: 0.85 mg/dL (ref 0.40–1.50)
Calcium: 10 mg/dL (ref 8.4–10.5)
GFR: 112.21 mL/min (ref >60.00)
GLUCOSE: 97 mg/dL (ref 70–99)
POTASSIUM: 3.6 meq/L (ref 3.5–5.1)
SODIUM: 141 meq/L (ref 135–145)

## 2014-09-10 NOTE — Assessment & Plan Note (Signed)
Pt's PE WNL.  Check labs.  Anticipatory guidance provided.  

## 2014-09-10 NOTE — Progress Notes (Signed)
Pre visit review using our clinic review tool, if applicable. No additional management support is needed unless otherwise documented below in the visit note. 

## 2014-09-10 NOTE — Patient Instructions (Signed)
Follow up in 1 year or as needed Keep up the good work!  You look great! We'll notify you of your lab results and make any changes if needed Call with any questions or concerns Have a wonderful trip!!!

## 2014-09-10 NOTE — Progress Notes (Signed)
   Subjective:    Patient ID: Dennis Mcclure, male    DOB: 1985-10-20, 29 y.o.   MRN: [id number]  HPI CPE- no concerns today   Review of Systems Patient reports no vision/hearing changes, anorexia, fever ,adenopathy, persistant/recurrent hoarseness, swallowing issues, chest pain, palpitations, edema, persistant/recurrent cough, hemoptysis, dyspnea (rest,exertional, paroxysmal nocturnal), gastrointestinal  bleeding (melena, rectal bleeding), abdominal pain, excessive heart burn, GU symptoms (dysuria, hematuria, voiding/incontinence issues) syncope, focal weakness, memory loss, numbness & tingling, skin/hair/nail changes, depression, anxiety, abnormal bruising/bleeding, musculoskeletal symptoms/signs.     Objective:   Physical Exam General Appearance:    Alert, cooperative, no distress, appears stated age  Head:    Normocephalic, without obvious abnormality, atraumatic  Eyes:    PERRL, conjunctiva/corneas clear, EOM's intact, fundi    benign, both eyes       Ears:    Normal TM's and external ear canals, both ears  Nose:   Nares normal, septum midline, mucosa normal, no drainage   or sinus tenderness  Throat:   Lips, mucosa, and tongue normal; teeth and gums normal  Neck:   Supple, symmetrical, trachea midline, no adenopathy;       thyroid:  No enlargement/tenderness/nodules  Back:     Symmetric, no curvature, ROM normal, no CVA tenderness  Lungs:     Clear to auscultation bilaterally, respirations unlabored  Chest wall:    No tenderness or deformity  Heart:    Regular rate and rhythm, S1 and S2 normal, no murmur, rub   or gallop  Abdomen:     Soft, non-tender, bowel sounds active all four quadrants,    no masses, no organomegaly  Genitalia:    Normal male without lesion, masses,discharge or tenderness  Rectal:    Deferred due to young age  Extremities:   Extremities normal, atraumatic, no cyanosis or edema  Pulses:   2+ and symmetric all extremities  Skin:   Skin color, texture, turgor  normal, no rashes or lesions  Lymph nodes:   Cervical, supraclavicular, and axillary nodes normal  Neurologic:   CNII-XII intact. Normal strength, sensation and reflexes      throughout          Assessment & Plan:

## 2015-09-12 ENCOUNTER — Encounter: Payer: Managed Care, Other (non HMO) | Admitting: Family Medicine
